# Patient Record
Sex: Male | Born: 1980 | Race: White | Hispanic: No | Marital: Single
Health system: Midwestern US, Community
[De-identification: ages and names within clinical notes are randomized; demographics above are authoritative.]

---

## 2010-06-09 ENCOUNTER — Inpatient Hospital Stay
Admit: 2010-06-09 | Discharge: 2010-06-09 | Disposition: A | Discharge status: Home / Self Care | Attending: Emergency Medicine

## 2010-06-09 MED ORDER — LORAZEPAM 1 MG PO TABS
1 MG | Freq: Once | ORAL | Status: AC
Start: 2010-06-09 — End: 2010-06-09
  Administered 2010-06-09: 19:00:00 via ORAL

## 2010-06-09 MED ORDER — OXCARBAZEPINE 150 MG PO TABS
150 MG | ORAL_TABLET | Freq: Every day | ORAL | Status: AC
Start: 2010-06-09 — End: 2010-07-09

## 2010-06-09 MED ORDER — LORAZEPAM 1 MG PO TABS
1 MG | ORAL_TABLET | Freq: Two times a day (BID) | ORAL | Status: AC
Start: 2010-06-09 — End: 2010-06-11

## 2010-06-09 MED ADMIN — oxcarbazepine (TRILEPTAL) tablet 300 mg: ORAL | @ 19:00:00 | NDC 00054009820

## 2010-06-09 MED FILL — OXCARBAZEPINE 300 MG PO TABS: 300 MG | ORAL | Qty: 1

## 2010-06-09 MED FILL — LORAZEPAM 1 MG PO TABS: 1 MG | ORAL | Qty: 1

## 2010-06-09 NOTE — Progress Notes (Signed)
Pt. Slightly forgetable, but comprehending d/c instructions. Significant other at bedside. Scripts given after PO meds given. D/c to [facility] with F/U instructions.

## 2010-06-09 NOTE — ED Provider Notes (Signed)
Chief complaint - seizure    HPI  Patient is a 29 y/o male with a history of seizures who presents today after a seizure.  Patient states he was at a meeting today when he became dizzy and then lost consciousness coming to in an ambulance.  Patient states that he was somewhat confused after the seizure but currently feels like his normal self.  He states that he does not think he hit his head but does admit to biting his tongue. He denies any other symptoms including headache, chest pain, loss of bowel/bladder continence, SOB, abdominal pain. Of note patient has an 8 year long history of seizures.  He has tried taking dilantin and most recently trileptal for seizure control.  He stopped taking his trileptal approximately 8 months ago as he had not had any seizures since April 2010 and then lost his benefits. He was followed by a neurologist in [place] but has since moved and not established primary care or neurology care since being in [place] (where he resides).     Review of Systems   Constitutional: Negative.  Negative for fever, activity change and fatigue.   HENT:        Tongue pain, bit tongue   Eyes: Negative.  Negative for visual disturbance.   Respiratory: Negative.  Negative for shortness of breath.    Cardiovascular: Negative.  Negative for chest pain and palpitations.   Gastrointestinal: Negative.  Negative for nausea, vomiting, abdominal pain, diarrhea and constipation.   Genitourinary: Negative.    Musculoskeletal: Negative.    Neurological: Positive for seizures.       Physical Exam   Constitutional: He is oriented to person, place, and time. He appears well-developed and well-nourished. No distress.   HENT:   Head: Normocephalic.   Mouth/Throat: Oropharynx is clear and moist. No oropharyngeal exudate.        Two abrasions on right side of tongue   Eyes: Conjunctivae and EOM are normal. Pupils are equal, round, and reactive to light.   Neck: Normal range of motion. Neck supple.   Cardiovascular:  Normal rate, regular rhythm, normal heart sounds and intact distal pulses.  Exam reveals no gallop and no friction rub.    No murmur heard.  Pulmonary/Chest: Effort normal and breath sounds normal. No respiratory distress. He has no wheezes. He has no rales.   Abdominal: Soft. Bowel sounds are normal. No tenderness.   Musculoskeletal: Normal range of motion. He exhibits no edema and no tenderness.   Neurological: He is alert and oriented to person, place, and time.   Skin: Skin is warm and dry.   Psychiatric: He has a normal mood and affect.       Procedures    MDM  Patient is a 29 y/o male with a past history of seizures who has been off his trileptal for 8 months now presenting after his typical seizure.  Patient was seen and evaluated by myself and the attending physician. All management decisions were discussed and agreed upon with the attending.   Given that patient has a history of seizures and has been off his anticonvulsant medication, I think this is likely his typical seizure in the setting of medication noncompliance. The patient has no other complaints, a normal exam and is now at his baseline.  I am not concerned for acute intracranial hemorrhage or other intracranial pathology at this time, therefore head imaging was not obtained. I also do not think that patient has any metabolic abnormality that  would account for his seizure therefore laboratory data was not obtained. The patient was given a dose of his previous trileptal dose 300mg  and ativan 1 mg while in the ED.  He was also prescribed trileptal 300mg  once daily and 1 mg ativan BID for 2 days for seizure prevention as an outpatient.  The patient remained hemodynamically stable and without symptoms or recurrent seizure and therefore was safe for discharge home in good condition.   Impression - seizure  Dispo - home, good condition  Plan- patient provided with trileptal 300mg  qday for 30 days one refill. Ativan 1mg  BID for 2 days           Patient  instructed to follow-up with neurology and obtain a primary care provider           Return to ED for headache, confusion, fever, altered mental status, status epilepticus or for other          Concerns.    Labs      Radiology       EKG Interpretation      Aviva Signs, MD  Resident  06/09/10 [id number]    Barbaraann Share, MD  06/11/10 1106

## 2010-06-09 NOTE — Discharge Instructions (Signed)
Adult Seizure     A seizure is when the body shakes uncontrollably (convulsion). It can be a scary experience. A seizure is not a diagnosis. It is a sign that something else may be wrong with brain and/or spinal cord (central nervous system).  In the Emergency Department, your condition is evaluated. The seizure is then treated. You will likely need follow-up with your caregiver. You will possibly need further testing and evaluation. Your caregiver or the specialist to whom you are referred will determine if further treatment is needed.     After a seizure, you may be confused, dazed and drowsy. These problems (symptoms) often follow a seizure. Medication given to treat the seizure may also cause some of these changes. This pause is a refractory period. Because of this, hospital admission is seldom required unless there are other conditions present such as trauma or metabolic problems.     Sometimes the seizure activity follows a fainting episode. This may have been caused by a brief drop in blood pressure. These fainting (syncopal) seizures are generally not a cause for concern.       HOME CARE INSTRUCTIONS   Follow up with your caregiver as suggested.   If any problems happen, get help right away.   Do not swim or drive until your caregiver says it is okay.     Document Released: 09/09/2000  Document Re-Released: 06/21/2008  Exit[employer] Patient Information 2011 [employer].

## 2010-06-09 NOTE — ED Provider Notes (Signed)
This patient was seen by the resident.?? I have seen and examined the patient, agree with the workup, evaluation, management and diagnosis.  Care plan has been discussed.      Barbaraann Share, MD  06/09/10 [phone]

## 2014-07-25 ENCOUNTER — Ambulatory Visit: Admit: 2014-07-25 | Discharge: 2014-07-25 | Payer: PRIVATE HEALTH INSURANCE | Attending: Orthopaedic Surgery

## 2014-07-25 ENCOUNTER — Ambulatory Visit: Admit: 2014-07-25 | Payer: PRIVATE HEALTH INSURANCE

## 2014-07-25 DIAGNOSIS — M25512 Pain in left shoulder: Secondary | ICD-10-CM

## 2014-07-25 NOTE — Progress Notes (Signed)
REFERRING PHYSICIAN: Daylene KatayamaEd Marcheschi MD     CHIEF COMPLAINT:  Left shoulder     HISTORY OF PRESENT ILLNESS: Alan Smith is a 33 year old left-hand-dominant male who presents today for evaluation at the request of Dr. Sedalia MutaEd Marcheshi for evaluation of his left shoulder pain.  The patient reports that when he was 33 years old he had 2 unrelated events.  He 1st had an unwitnessed seizure.  This was his 1st seizure of them.  The 2nd episode that happened when he was 20 was that he sustained an anterior shoulder dislocation.  The etiology of his current seizure disorder is unknown he has recently had a workup from the [facility] and [facility] evaluation.  He last had a seizure event on Sunday of this week.  In April 2014 he had a seizure event and then had surgical intervention at [facility] by Dr. Gareth EagleKeith Kenter.  In April 2014 he underwent a left shoulder proximal humerus osteotomy with greater tuberosity with open reduction and internal fixation as well as an open coracoid transfer.  Postoperatively, he did outpatient physical therapy but continued to have difficulties with range of motion.  He has not had any recurrent instability of the left shoulder.    REVIEW OF SYSTEMS:  Relevant review of systems reviewed and available in the patient's chart:     PHYSICAL EXAMINATION: Inspection of the left shoulder reveals warm, dry, intact skin.  There is no adenopathy. The distal neurovascular exam is grossly intact.   The previous incision is well-healed.  There is no surrounding erythema.  He has active forward elevation 90??.  He has external rotation with the arm at the side to neutral.  At maximum abduction of 50?? he has 0?? of internal rotation and 0?? of external rotation.  There is a negative sulcus sign.  He is able to maintain 30?? of active abduction however, I can appreciate a very small contraction of the deltoid.  He has 3+ out of 5  active biceps strength testing.  External rotation strength testing with the arm at the side 4 minus out of 5 with significant diminished endurance.  Forward flexion strength testing 4 minus out of 5 with significant diminished endurance.  He will flex and extend the wrist in the thumb.    Examination of the contralateral shoulder reveals no atrophy or deformity. The skin is warm and dry. Range of motion is within normal limits. There is no focal tenderness with palpation. Provocative SLAP, biceps tension, apprehension AC joint or rotator cuff tests are negative. Strength is graded 5/5 in all muscle groups. The distal neurovascular exam is grossly intact.    Cervical spine: The skin is warm and dry. There is no swelling, warmth, or erythema. Range of motion is within normal limits. There is no paraspinal or spinous process tenderness. Spurling's sign is negative and did not produce shoulder pain. The distal neurovascular exam is grossly intact.    X-RAYS:  5 views of the left shoulder were ordered and reviewed in the office today.  These demonstrate 2 screws consistent with Laterjet procedure.  I am unable to appreciate the coracoid graft.  He does have a large Hill-Sachs deformity of the posterior aspect of the humeral head.  The glenohumeral joint space is well maintained.    ASSESSMENT:    1.  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  He is not pleased with his left upper extremity.  I have a concern about a possible neuropathy of the left upper extremity especially in the setting of his previous open stabilization procedure.  I am recommending an EMG evaluation of the left upper extremity and also a CT arthrogram to assess the glenoid bone graft healing as well as the integrity of his rotator cuff.  He will  return to the office following these to test for discussion of results and treatment options.  He voiced understanding and agreed.

## 2014-07-25 NOTE — Patient Instructions (Signed)
I have reviewed the provider's instructions with the patient, answering all questions to his satisfaction.

## 2014-07-29 NOTE — Telephone Encounter (Signed)
Called patient to let them know of MRI/CT SCAN approval. SCHEDULED FOR 08/07/2014 8AM.  Alan Smith has been faxed auth# and demographic information. Patient was instructed to call the central scheduling department for a follow-up appointment after test is scheduled.

## 2014-08-07 NOTE — Telephone Encounter (Signed)
SPOKE WITH PATIENT ABOUT CT SCAN AND FOLLOW-UP APPOINTMENT. HE ORIGINALLY THOUGHT HE MISSED HIS CT BUT HAD HIS DAYS CONFUSED. HE DID COMPLETE IT AND IS SCHEDULED FOR EMG AND FOLLOW-UP ON 08/26/2014.

## 2014-08-26 ENCOUNTER — Ambulatory Visit
Admit: 2014-08-26 | Discharge: 2014-08-26 | Payer: PRIVATE HEALTH INSURANCE | Attending: Physical Medicine & Rehabilitation

## 2014-08-26 ENCOUNTER — Ambulatory Visit: Admit: 2014-08-26 | Discharge: 2014-08-26 | Payer: PRIVATE HEALTH INSURANCE | Attending: Orthopaedic Surgery

## 2014-08-26 DIAGNOSIS — M21829 Other specified acquired deformities of unspecified upper arm: Secondary | ICD-10-CM

## 2014-08-26 DIAGNOSIS — G5690 Unspecified mononeuropathy of unspecified upper limb: Secondary | ICD-10-CM

## 2014-08-26 NOTE — Progress Notes (Signed)
Here for EMG/NCS testing.  Results in Media section/tab.

## 2014-08-26 NOTE — Patient Instructions (Signed)
I have reviewed the provider's instructions with the patient, answering all questions to his satisfaction.

## 2014-08-26 NOTE — Progress Notes (Signed)
HISTORY OF PRESENT ILLNESS: Alan Smith returns today for a follow-up evaluation of the left upper extremity.  He presents today for CT arthrogram and EMG results.    The patient reports that when he was 33 years old he had 2 unrelated events.  He 1st had an unwitnessed seizure.  This was his 1st seizure of them.  The 2nd episode that happened when he was 20 was that he sustained an anterior shoulder dislocation.  The etiology of his current seizure disorder is unknown he has recently had a workup from [facility] and [facility] further evaluation.  He last had a seizure event on Sunday of this week.  In April 2014 he had a seizure event and then had surgical intervention at the [facility] by Dr. Gareth EagleKeith Kenter.  In April 2014 he underwent a left shoulder proximal humerus osteotomy with greater tuberosity with open reduction and internal fixation as well as an open coracoid transfer.  Postoperatively, he did outpatient physical therapy but continued to have difficulties with range of motion.  He has not had any recurrent instability of the left shoulder.    REVIEW OF SYSTEMS:  Relevant review of systems reviewed and available in the patient's chart:     PHYSICAL EXAMINATION: Inspection of the left shoulder reveals warm, dry, intact skin.  There is no adenopathy. The distal neurovascular exam is grossly intact.   The previous incision is well-healed.  There is no surrounding erythema.  He has active forward elevation 90??.  He has external rotation with the arm at the side to neutral.  At maximum abduction of 50?? he has 0?? of internal rotation and 0?? of external rotation.  There is a negative sulcus sign.  He is able to maintain 30?? of active abduction however, I can appreciate a very small contraction of the deltoid.  He has 3+ out of 5 active biceps strength testing.  External rotation strength testing with the arm at the side 4 minus out of  5 with significant diminished endurance.  Forward flexion strength testing 4 minus out of 5 with significant diminished endurance.  He will flex and extend the wrist in the thumb.    Examination of the contralateral shoulder reveals no atrophy or deformity. The skin is warm and dry. Range of motion is within normal limits. There is no focal tenderness with palpation. Provocative SLAP, biceps tension, apprehension AC joint or rotator cuff tests are negative. Strength is graded 5/5 in all muscle groups. The distal neurovascular exam is grossly intact.    Cervical spine: The skin is warm and dry. There is no swelling, warmth, or erythema. Range of motion is within normal limits. There is no paraspinal or spinous process tenderness. Spurling's sign is negative and did not produce shoulder pain. The distal neurovascular exam is grossly intact.    X-RAYS:  5 views of the left shoulder were reviewed in the office today.  These demonstrate 2 screws consistent with Laterjet procedure.  I am unable to appreciate the coracoid graft.  He does have a large Hill-Sachs deformity of the posterior aspect of the humeral head.  The glenohumeral joint space is well maintained.    CT ARTHROGRAM RESULTS:   1. Old Hill-Sachs deformity involving approximately 30% of the articular surface and bony    Bankart which was repaired. Partial osseous bridging between the bony fragment and the main    bone.   2.  Contrast extravasation into subacromial-subdeltoid bursa through the full-thickness,    incomplete tear of supraspinatus which measures 2.5cm in AP dimension and about 2cm medial    retraction. Articular-sided partial-thickness tear of the infraspinatus footprint, involving    less than 50% tendon thickness and measuring smaller than 0.5cm in AP and ML dimensions.   3. Biceps long head tendon is torn at the anchor and retracted beyond the bicipital groove.   4. Elevation of the humeral head due to diminished depressor mechanism.   5.   Osseous fragment demonstrates partial osseous bridging with the main bone.  A large defect with remodeling is present at the posterior lateral aspect of the humeral head.    EMG RESULTS:   1.  Mild chronic left axillary mononeuropathy  2.  No evidence of any other isolated mononeuropathies around the proximal left arm or shoulder  3.  No left cervical radiculopathy or brachial plexopathy    ASSESSMENT:    1.  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  Full-thickness supraspinatus rotator cuff tear, left shoulder  3.  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  Seizure disorder, unknown etiology    PLAN: I had a detailed discussion with Alan Smith.  He is currently seeing a neurologist and she is changed his medications.  He has a follow-up appointment with her in January to see how he is doing with his seizure control.  Unfortunately, since last office visit he sustained a witnessed seizure.  I recommended that he have full neurological workup and clearance and I would like to reevaluate him in the office in 6-8 weeks for possible discussion of left shoulder arthroscopy with rotator cuff repair.  He voiced understanding and agreed.

## 2018-10-29 ENCOUNTER — Encounter: Payer: PRIVATE HEALTH INSURANCE | Attending: Internal Medicine

## 2018-10-29 NOTE — Progress Notes (Signed)
Canceled appointment.

## 2022-06-17 IMAGING — MR MRI LUMBAR SPINE WITHOUT CONTRAST
6 of 8 series · 13 of 48 positions shown · IV contrast (gadolinium)
Comparison: Lumbar x-ray June 17, 2022. All

________________________________________________________________________________________________ 
MRI LUMBAR SPINE WITHOUT CONTRAST, 06/17/2022 [DATE]: 
CLINICAL INDICATION: Low back pain. Right hip pain.
TECHNIQUE: Multiplanar, multiecho position MR images of the lumbar spine were 
performed without intravenous gadolinium enhancement. Patient was scanned on a 
1.5T magnet.

[Series 101: survey · axial · 10.0mm · 1.25mm/px · 1 of 10 slices shown]
[im 1/10]
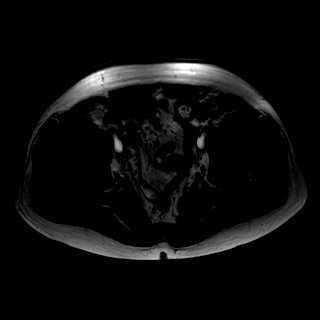

[Series 201: t2w_cor-surv · coronal · 6.0mm · 0.62mm/px · 2 of 14 slices shown]
[im 1/14]
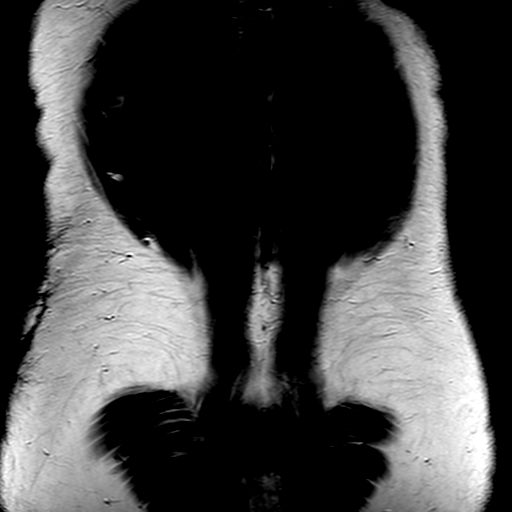
[im 14/14]
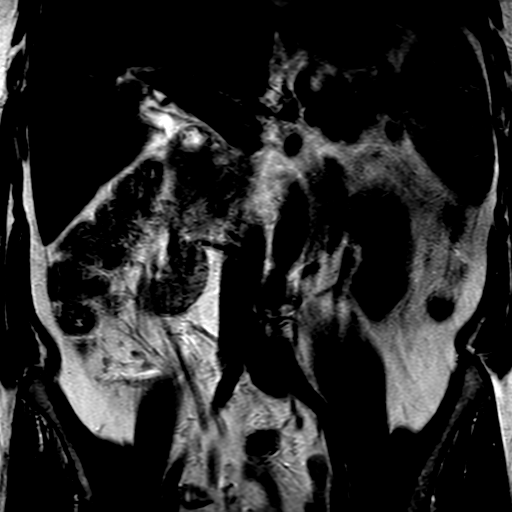

[Series 301: t1_tse_sag · sagittal · 4.0mm · 0.48mm/px · 3 of 19 slices shown]
[im 1/19]
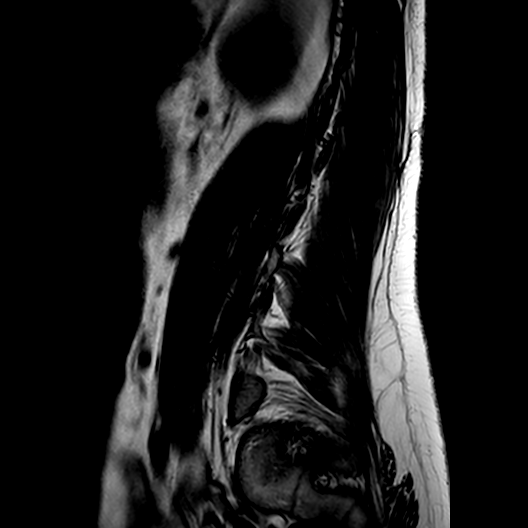
[im 10/19]
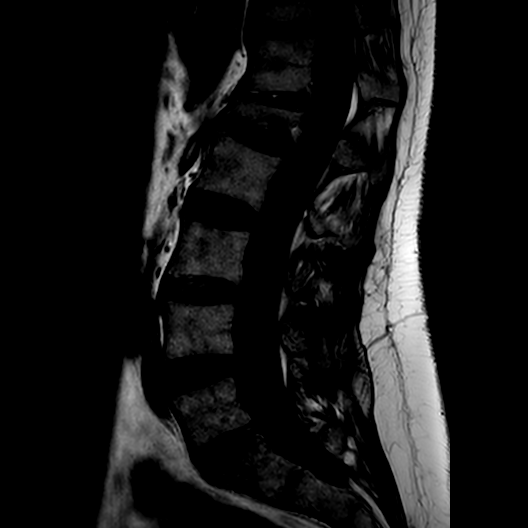
[im 19/19]
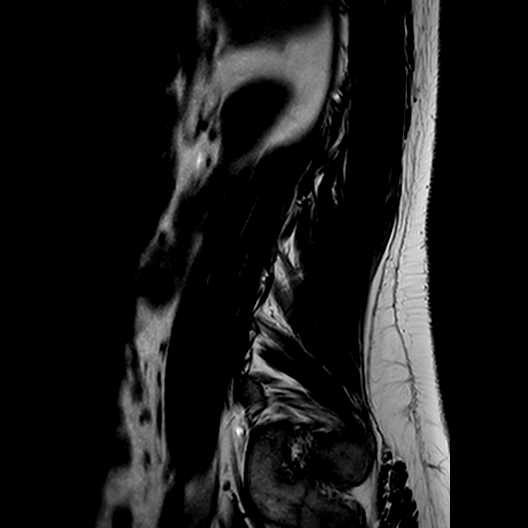

[Series 402: (id)_mdixon_tse · sagittal · 4.0mm · 0.40mm/px · 3 of 19 slices shown]
[im 1/19]
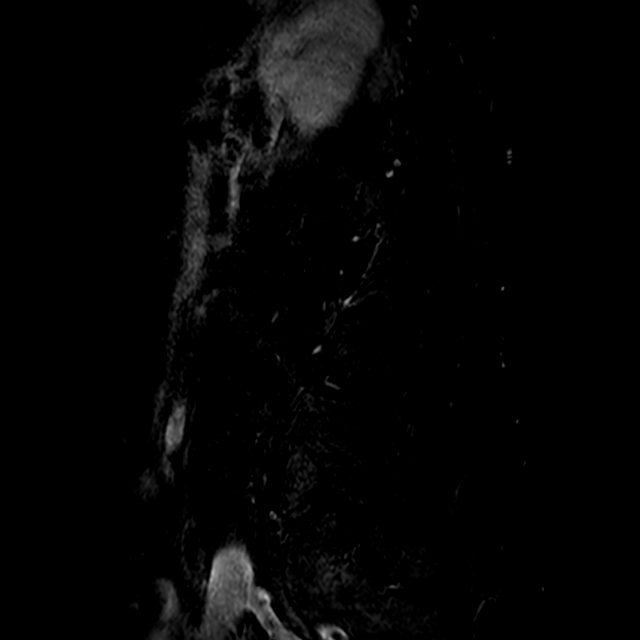
[im 10/19]
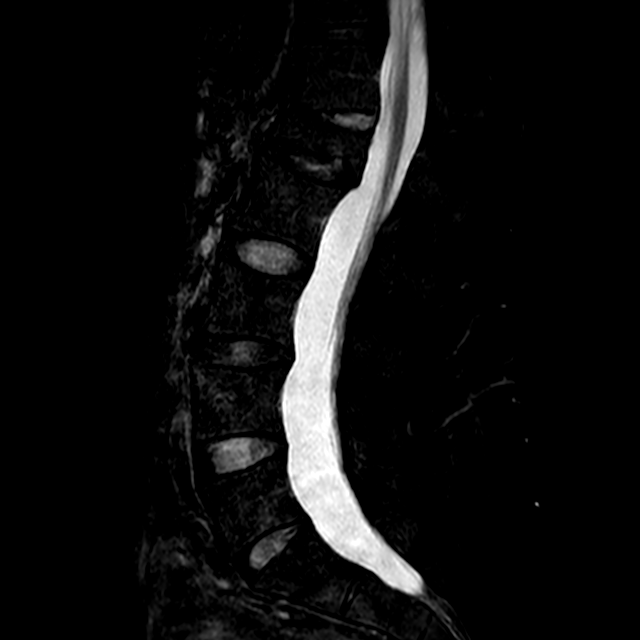
[im 19/19]
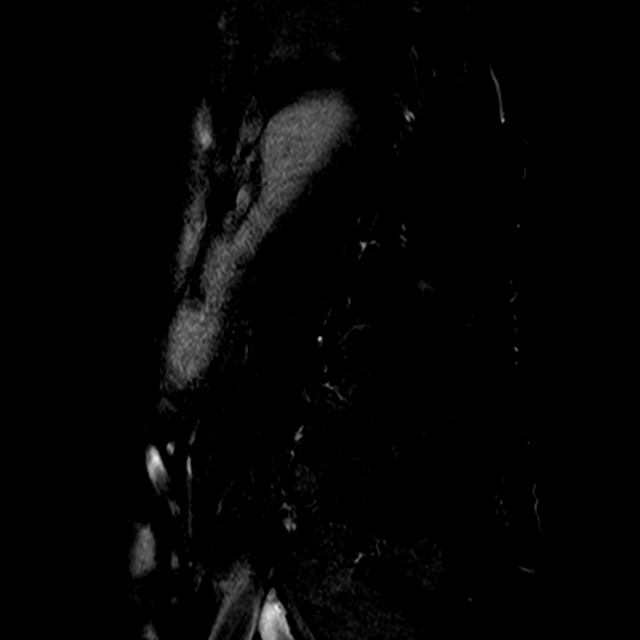

[Series 403: st2w_mdixon_tse · sagittal · 4.0mm · 0.40mm/px · 3 of 19 slices shown]
[im 1/19]
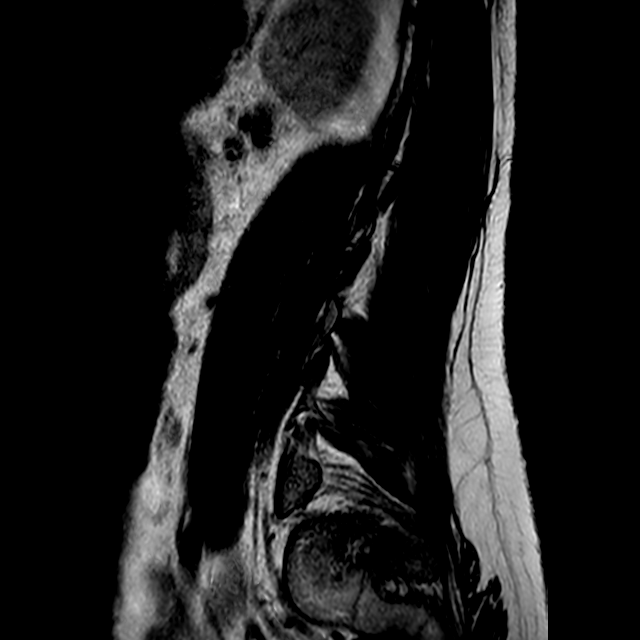
[im 10/19]
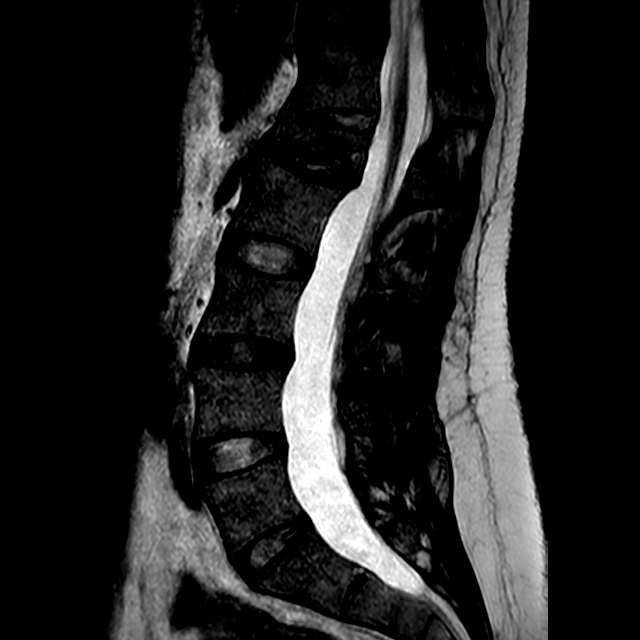
[im 19/19]
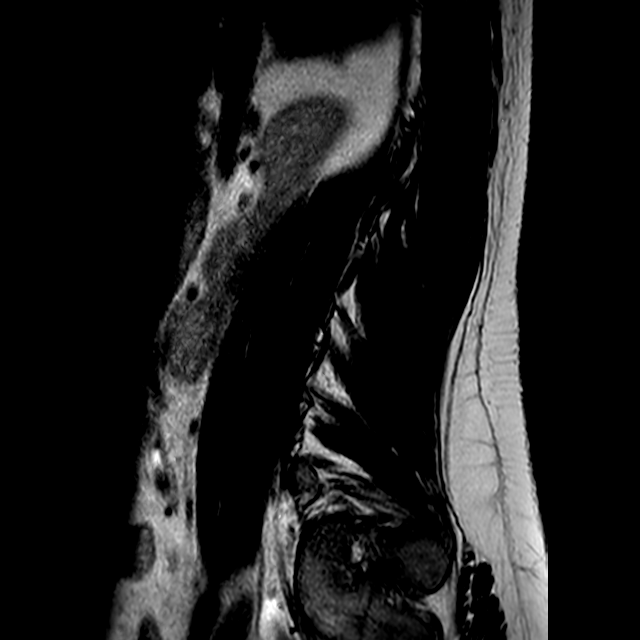

[Series 502: (id) view_ax mpr · axial · 1.0mm · 0.25mm/px · 1 of 100 slices shown]
[im 1/100]
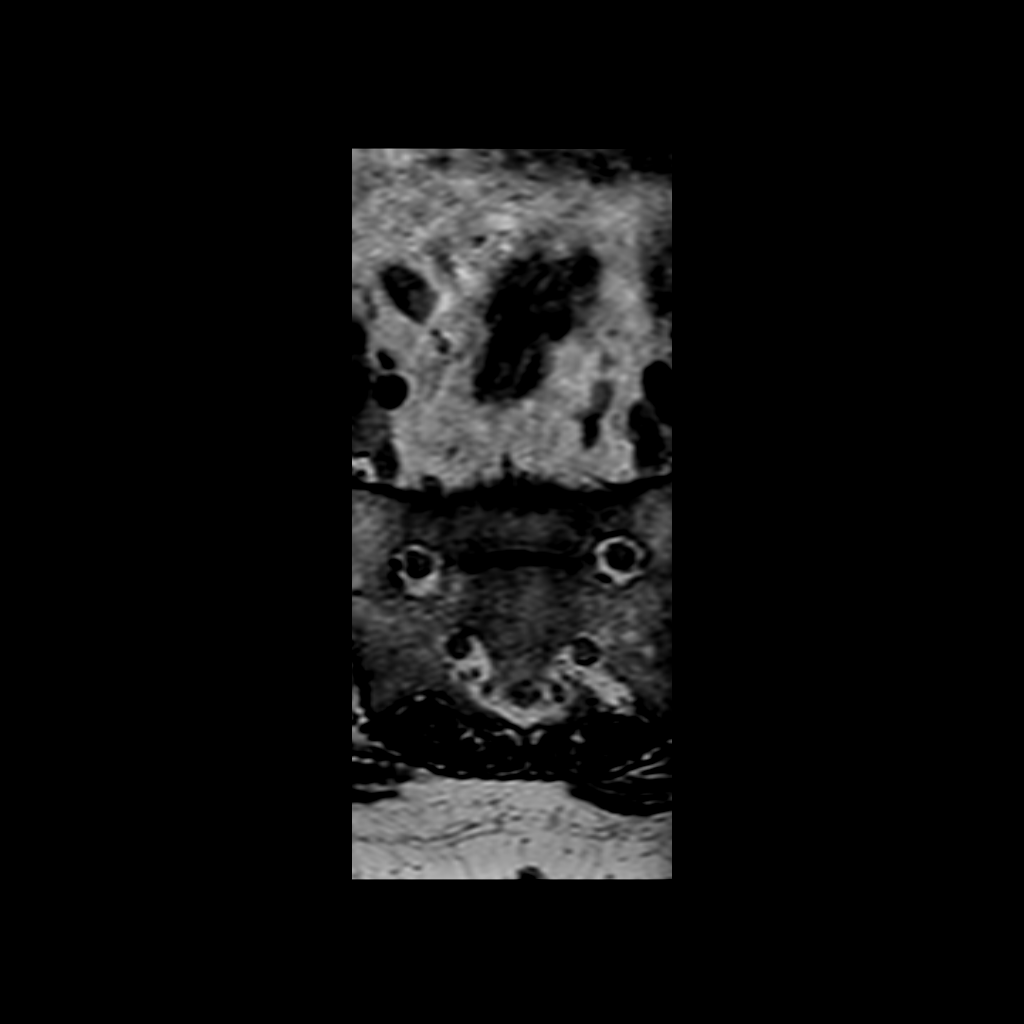

[13 of 48 positions shown; findings below may reference images not displayed]

FINDINGS: -------------------------------------------------------------------------------- 
------ 
GENERAL: 
5 lumbar type vertebral bodies. 
ALIGNMENT: Trace grade 1 retrolisthesis L1 on L2. Accentuated thoracic lumbar 
kyphosis related to the L1 compression deformity. Otherwise anatomic sagittal 
alignment. Minimal levoconvex thoracic lumbar scoliosis. 
VERTEBRAL BODY HEIGHT: Chronic moderate to severe L1 compression fracture 
deformity with near vertebral plana in the central aspect. There is 57% loss of 
vertebral body height along the anterior margin. 29% loss of vertebral body 
height along the posterior margin. No significant osseous retropulsion. No 
marrow edema L1 level. 
2. Limited MRI, the facets at L1 underlying normally.  
MARROW SIGNAL: No focal suspect signal abnormality. Isointense T1 and 
hyperintense T2 lesion within the T12 vertebral body could reflect an atypical 
hemangioma. It is technically indeterminate, however. 
CORD SIGNAL: Normal distal spinal cord and cauda equina. Conus medullaris 
terminates at L1. 
ADDITIONAL FINDINGS: The right kidney demonstrates findings that could be 
consistent with partial duplication of the collecting system. 
Modic I-II: None. 
Ligamentum Flavum > 2.5 mm: All levels except L5-S1. 
-------------------------------------------------------------------------------- 
------ 
SEGMENTAL: 
T12-L1: Severe loss of disc height anteriorly. Canal and foramina are patent. 
Normal facets. 
L1-L2: Mild loss of disc height and signal. Blunting of the disc into the 
inferior endplate of L1. Trace retrolisthesis. Canal and foramina are patent. 
Normal facets. 
L2-L3: Normal disc height and signal. No herniation. Mild facet arthropathy. No 
spinal canal or neural foraminal stenosis. 
L3-L4: Mild loss of disc height and signal. Canal and foramina are patent. Mild 
facet arthropathy. 
L4-L5: Normal disc height and signal. Minimal annular bulge. Canal and foramina 
are patent. Mild facet arthropathy. 
L5-S1: Normal disc height and signal. Canal and foramina are patent. Normal 
facets. 
-------------------------------------------------------------------------------- 
------
IMPRESSION: Chronic moderate to severe L1 compression deformity detailed above with 
accentuated thoracic lumbar kyphosis at this level. 
Presumed atypical hemangioma in the T12 vertebral body. This is technically 
indeterminate, however. 
Lumbar degenerative changes are mild in degree, detailed above on a level by 
level basis.

## 2022-06-17 IMAGING — DX LUMBAR SPINE AP, LAT WITH FLEXION AND EXTEN
1 series · 4 of 4 positions shown · non-contrast
Comparison: Lumbar spine MRI June 17, 2022.

________________________________________________________________________________________________ 
LUMBAR SPINE AP, LAT WITH FLEXION AND EXTEN, 06/17/2022 [DATE]: 
CLINICAL INDICATION: Low back pain.

[Series 1: lateral · 0.14mm/px · 4 of 4 slices shown]
[im 1/4]
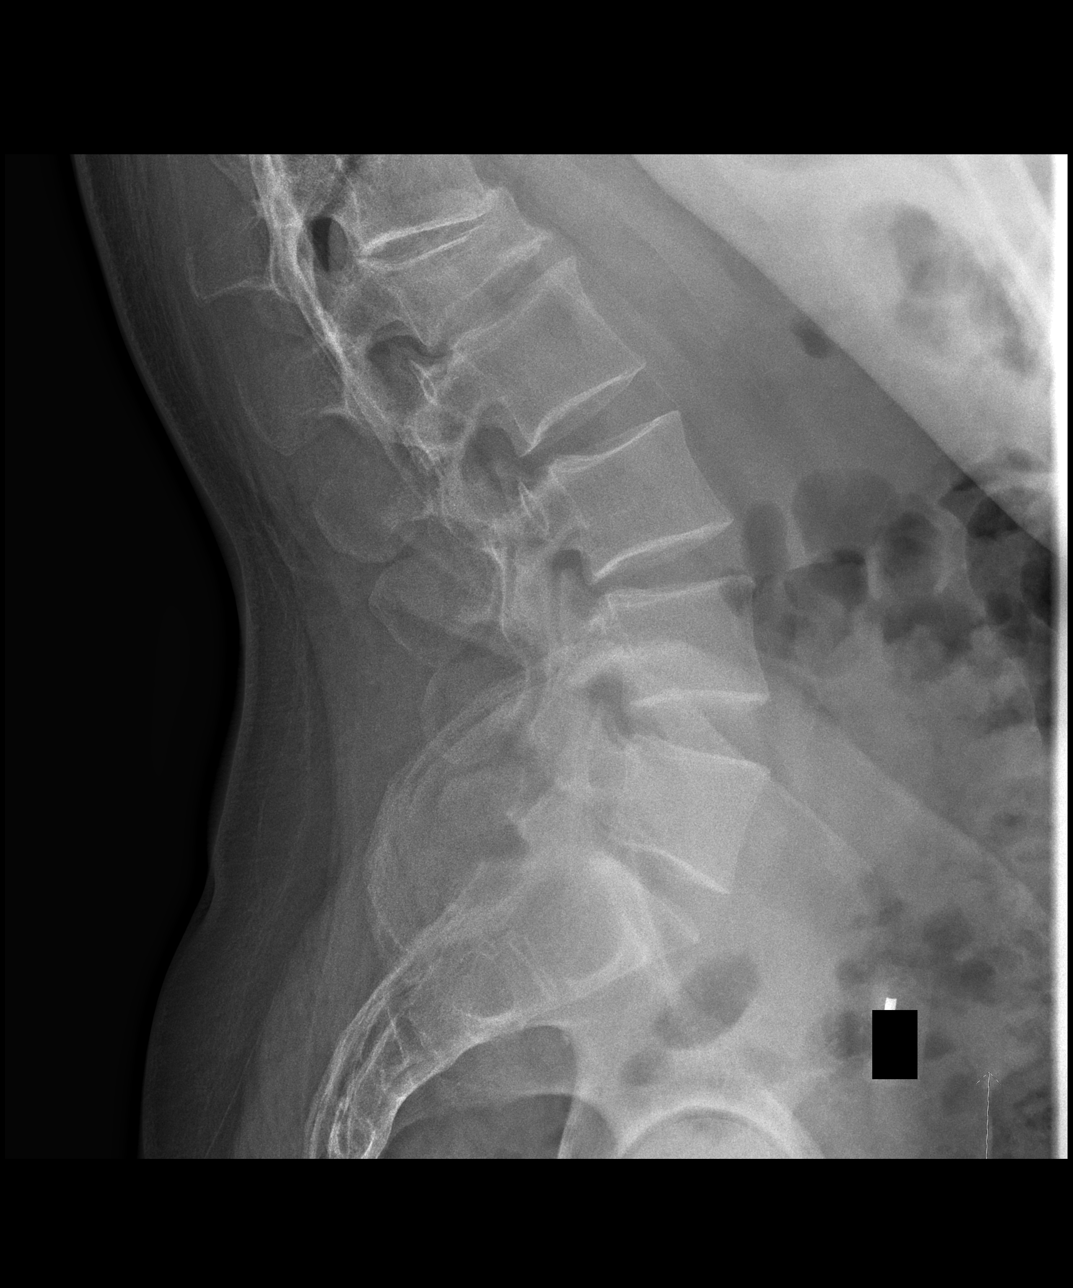
[im 2/4]
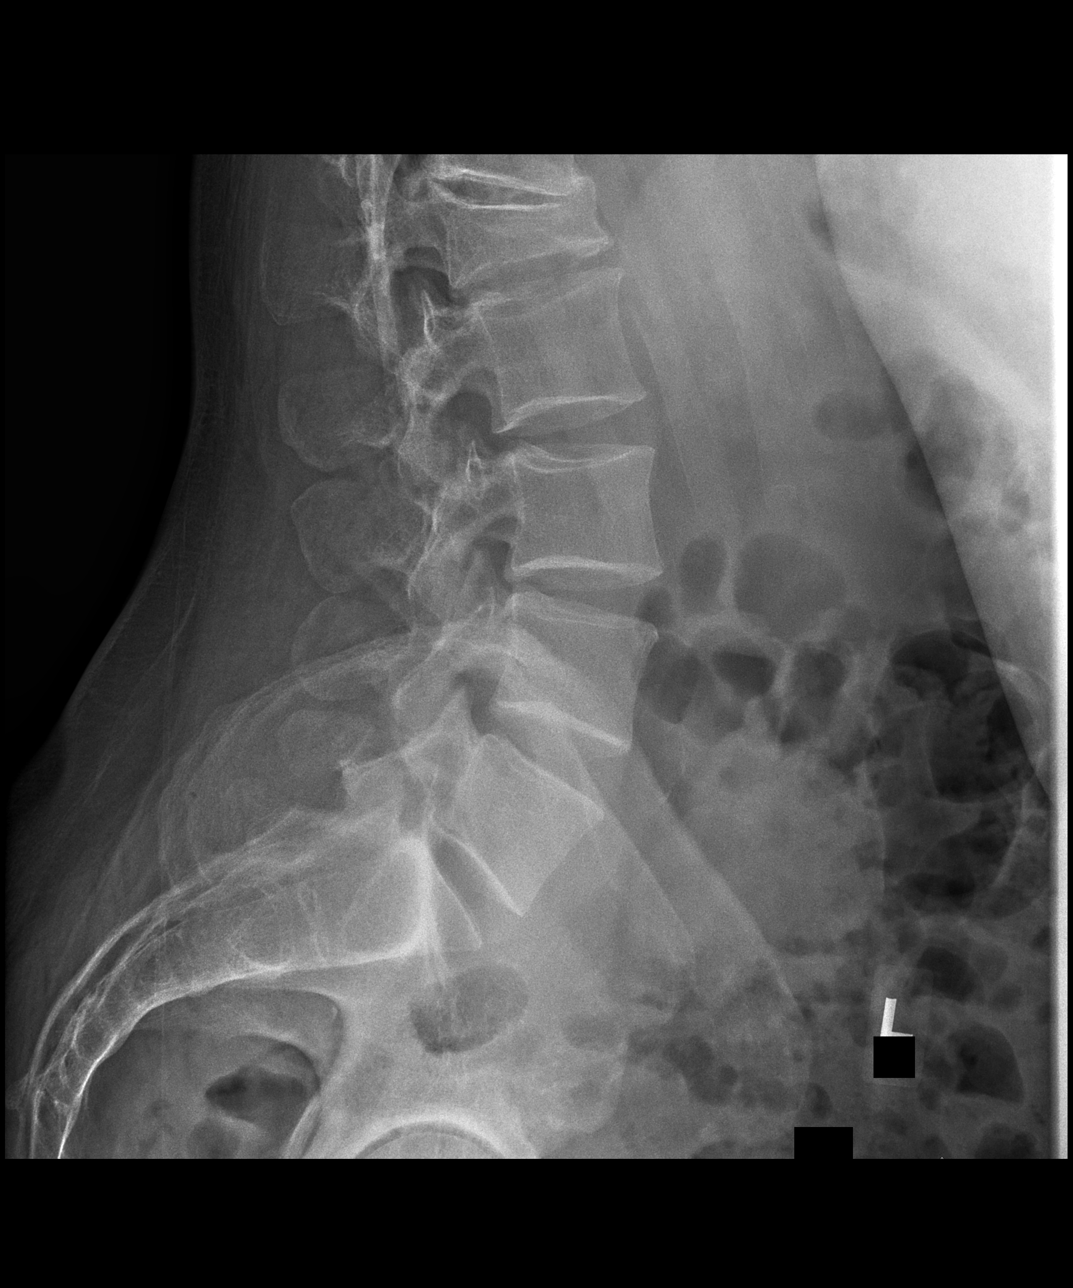
[im 3/4]
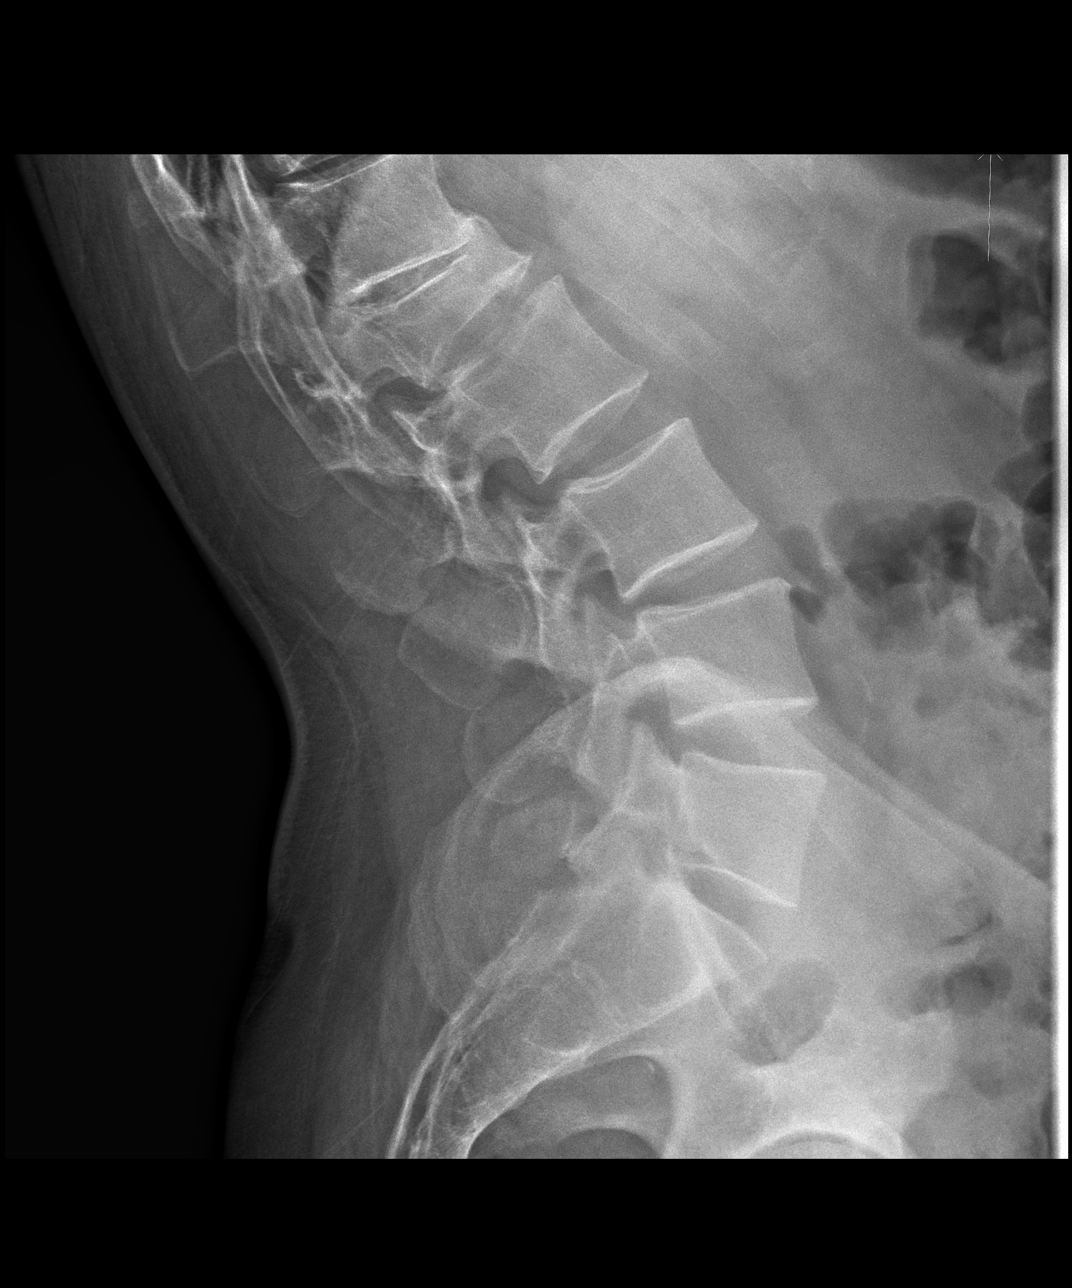
[im 4/4]
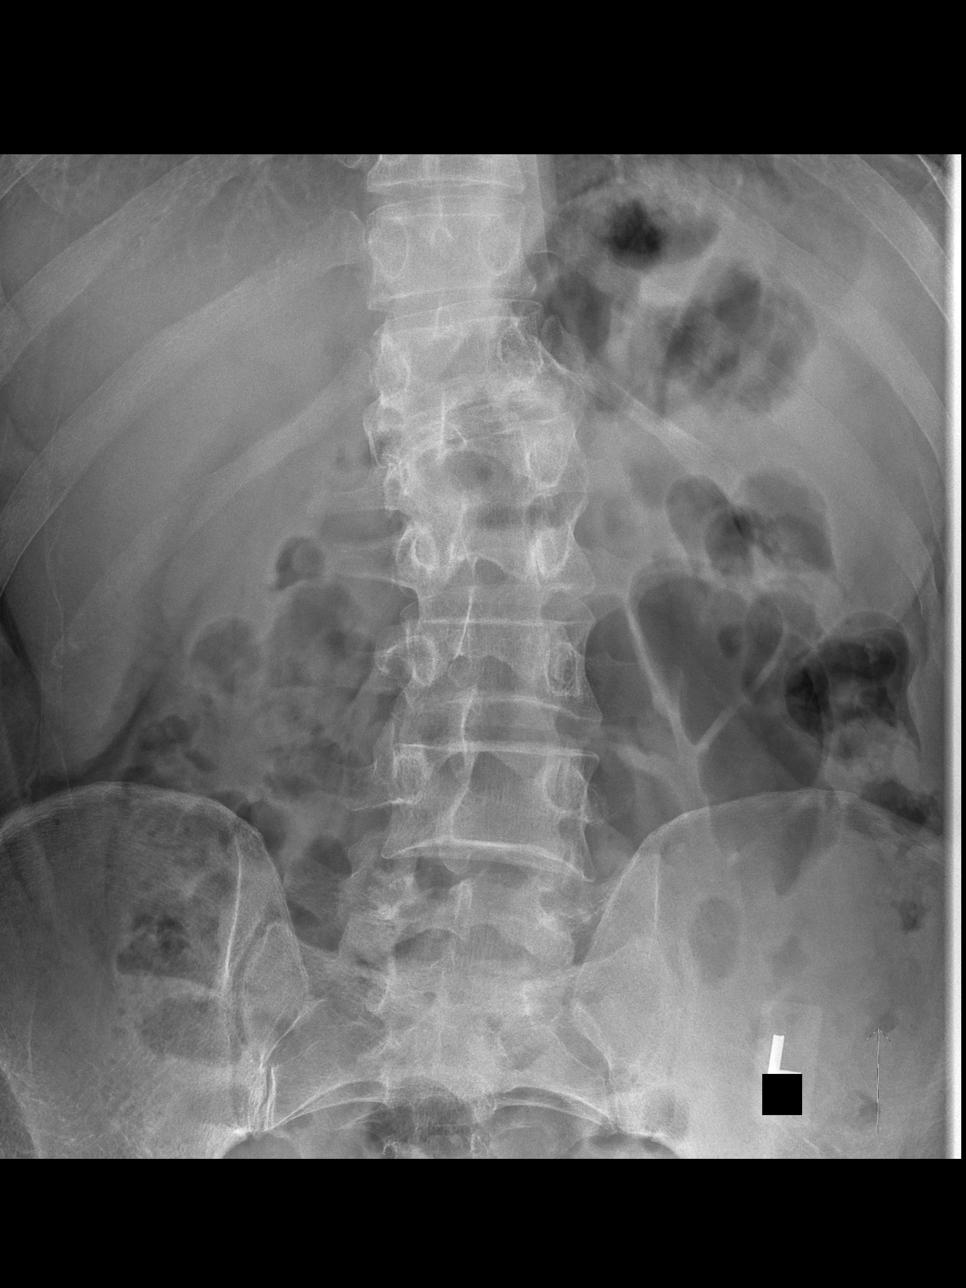

[4 of 4 positions shown; findings below may reference images not displayed]

FINDINGS: There is a moderate to moderately severe L1 compression fracture 
deformity. No significant osseous retropulsion, although there is trace grade 1 
retrolisthesis L1 on L2. Accentuated thoracic lumbar kyphosis related to the 
fracture. No change in alignment with flexion or extension and no evidence of 
dynamic instability. Otherwise there is anatomic sagittal alignment. Other 
vertebral bodies are preserved in height. 
Mild to moderate loss of disc height T12-L1 with mild loss of disc height L1-L2. 
Other disc spaces preserved. 
No other evidence of compression fracture. Extraspinal soft tissues 
unremarkable.
IMPRESSION: 1. Moderate to moderately severe L1 compression fracture deformity, age 
indeterminate. See MRI from today for better evaluation.

## 2022-08-24 IMAGING — MR MRI RIGHT HIP WITHOUT CONTRAST
4 of 7 series · 12 of 40 positions shown · IV contrast (gadolinium)
Comparison: None

________________________________________________________________________________________________ 
MRI RIGHT HIP WITHOUT CONTRAST, 08/24/2022 [DATE]: 
CLINICAL INDICATION: Trochanteric bursitis
TECHNIQUE: Multiplanar, multiecho position MR images of the hip were performed 
without intravenous gadolinium enhancement. Large field-of-view images were 
performed of the pelvis to include the contralateral hip for comparison. Patient 
was scanned on a 1.5T magnet.

[Series 401: survey · axial · 15.0mm · 1.76mm/px · z∈[-150,+105]mm · 3 of 14 slices shown]
[im 1/14]
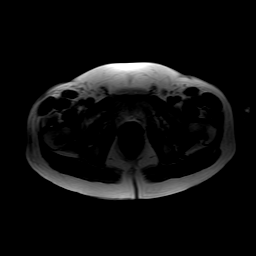
[im 7/14]
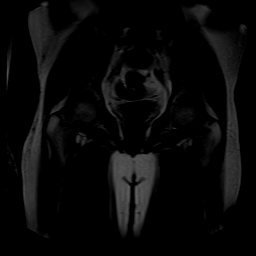
[im 14/14]
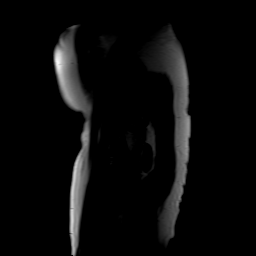

[Series 501: stir_cor-pelvis · coronal · 5.0mm · 0.58mm/px · 3 of 37 slices shown]
[im 7/37]
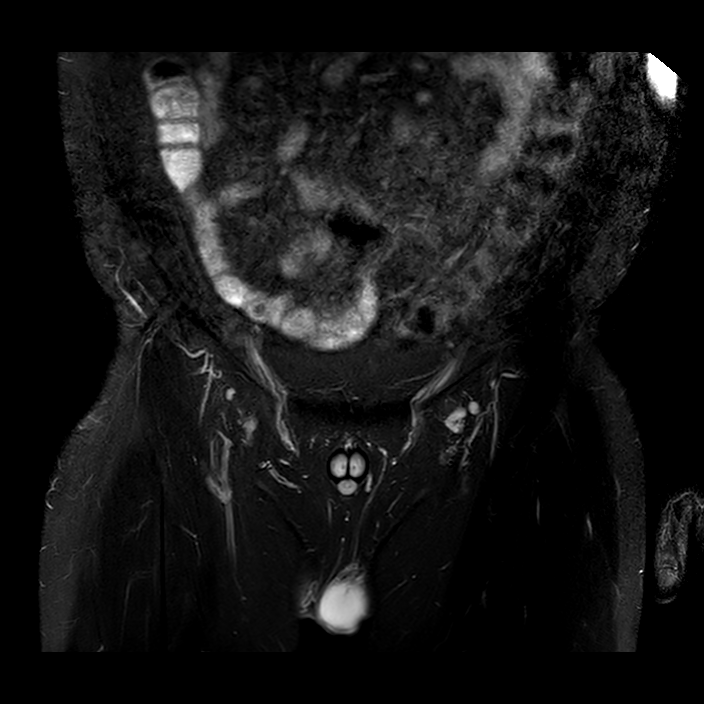
[im 19/37]
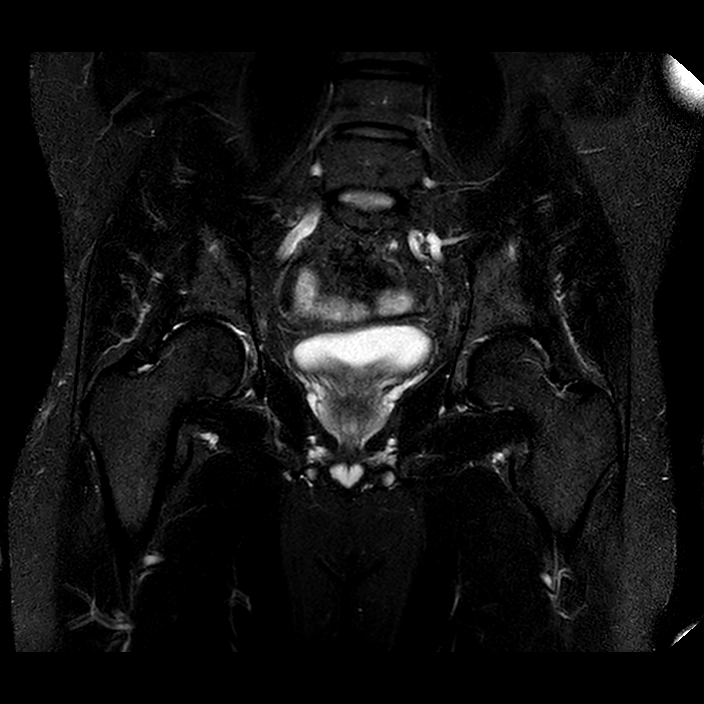
[im 31/37]
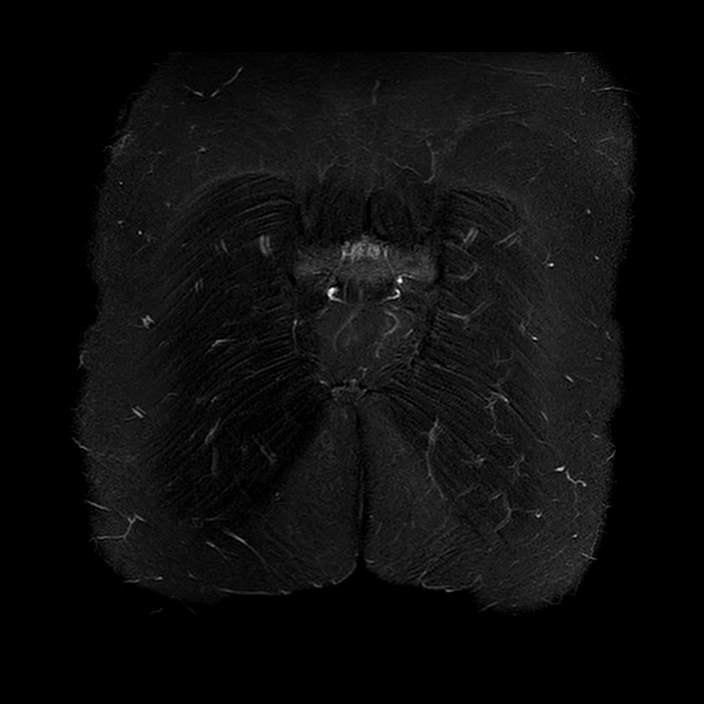

[Series 601: t1_(person_name) · axial · 5.0mm · 0.43mm/px · z∈[-191,-17]mm · 3 of 41 slices shown]
[im 6/41]
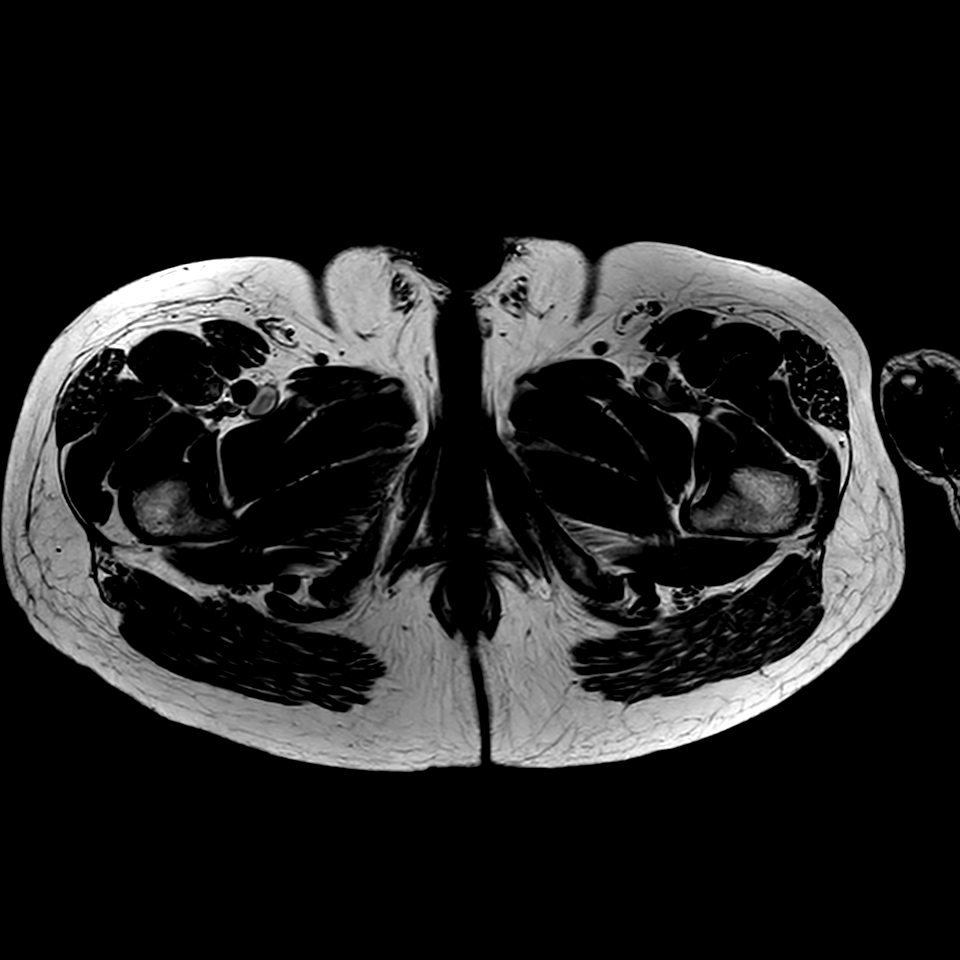
[im 23/41]
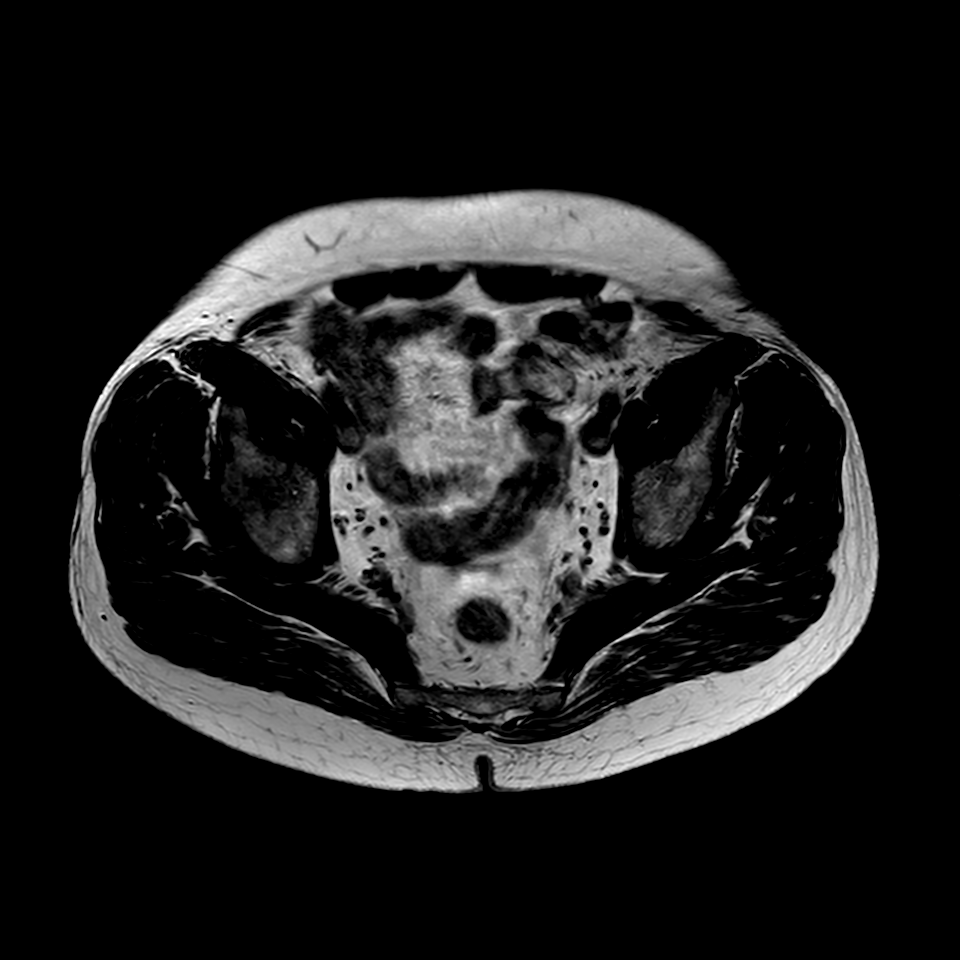
[im 35/41]
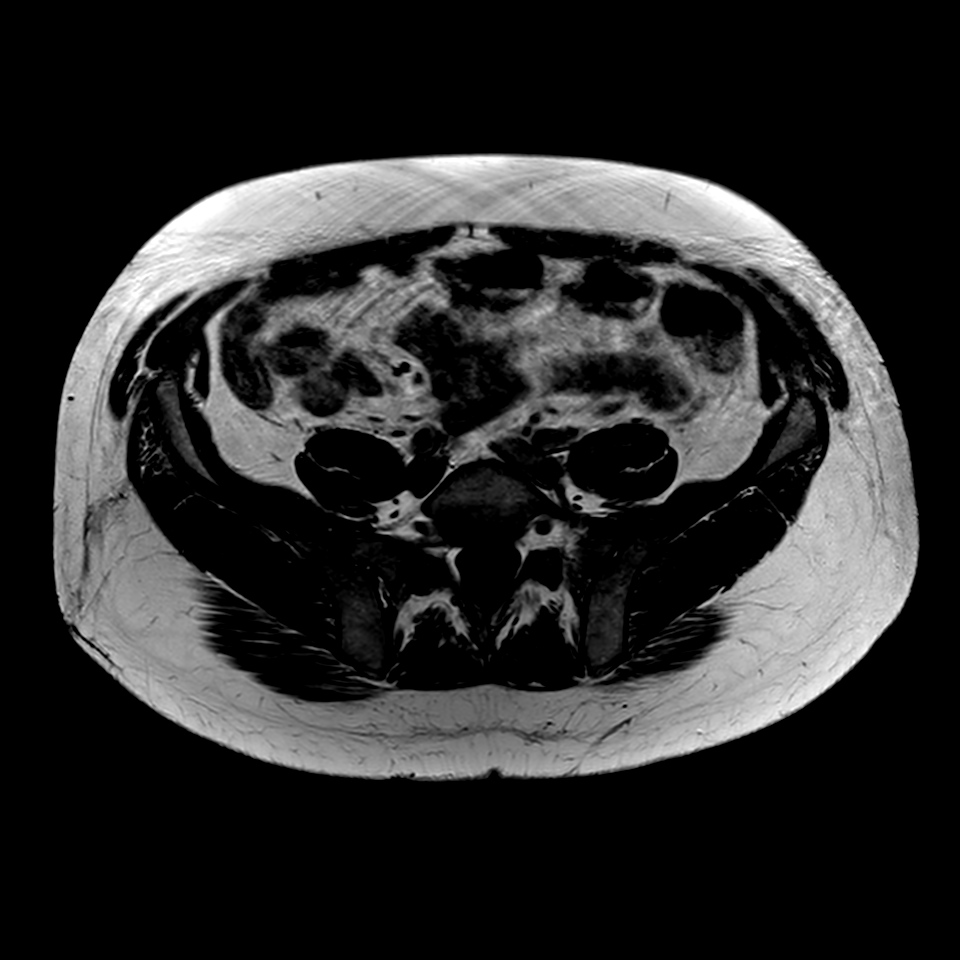

[Series 701: pd_fs_sag · sagittal · 4.0mm · 0.55mm/px · 3 of 33 slices shown]
[im 7/33]
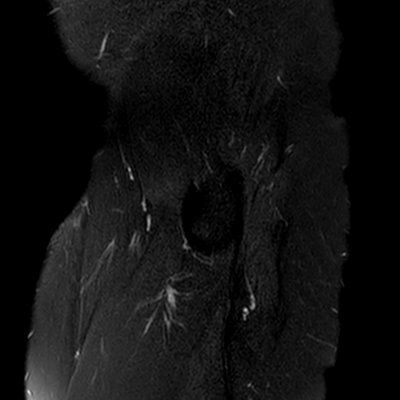
[im 20/33]
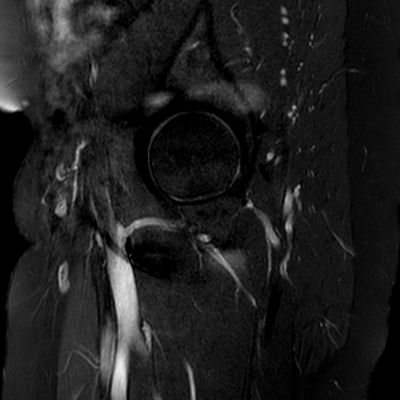
[im 33/33]
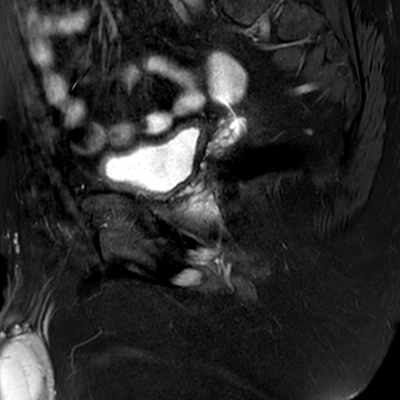

[12 of 40 positions shown; findings below may reference images not displayed]

FINDINGS: HIPS:  Mild bilateral partial thickness hip joint chondromalacia. No labral 
tear. No paralabral cyst. No hip joint effusion. Both femoral heads maintain a 
spherical configuration without evidence of avascular necrosis or subarticular 
collapse. No abnormal morphology of the proximal femurs or acetabula to 
predispose to impingement.  
BONES: Normal marrow signal intensity of the proximal hips, pelvis, sacrum and 
included lower lumbar spine. No fracture, contusion or marrow replacing lesion. 
SI joints are preserved. Mild degenerative change of the spine. 
SOFT TISSUES: The bilateral abductor cuffs are preserved. The insertions of the 
iliopsoas tendons are intact. The origins of the hamstrings are preserved. 
Rectus abdominis-adductor complex is preserved. The musculature is symmetric 
without strain, atrophy or mass. No focal fluid collection or distended bursa. 
Specifically, no iliopsoas or trochanteric bursitis. Included neurovascular 
bundles are negative. Intrapelvic contents are negative. Right 
posterolateral/lateral pelvic subcutaneous incisional scar.
IMPRESSION: 1.  No trochanteric bursitis. 
2.  Mild degenerative change of the hips. 
3.  Mild degenerative change of the spine: Please refer to 06/15/2022 MRI 
dictation. 
4.  Right posterolateral/lateral pelvic subcutaneous incisional scar.
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
